# Patient Record
Sex: Female | Born: 1995 | Race: White | Hispanic: No | Marital: Single | Smoking: Never smoker
Health system: Southern US, Community
[De-identification: ages and names within clinical notes are randomized; demographics above are authoritative.]

## PROBLEM LIST (undated history)

## (undated) HISTORY — PX: FINGER SURGERY: SHX640

---
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
## 2017-08-02 IMAGING — CT CT HEAD W/O CM
3 series · 15 of 47 positions shown, 18 images · non-contrast
Comparison: None.

CLINICAL DATA: Acute onset of anxiety and vomiting. Initial
encounter.

EXAM:
CT HEAD WITHOUT CONTRAST
TECHNIQUE: Contiguous axial images were obtained from the base of the skull
through the vertex without intravenous contrast.

[Series 2: head wo · axial · 0.47mm/px · z∈[-179,-54]mm · 9 of 30 slices shown, 12 images]
[im 3/30  brain]
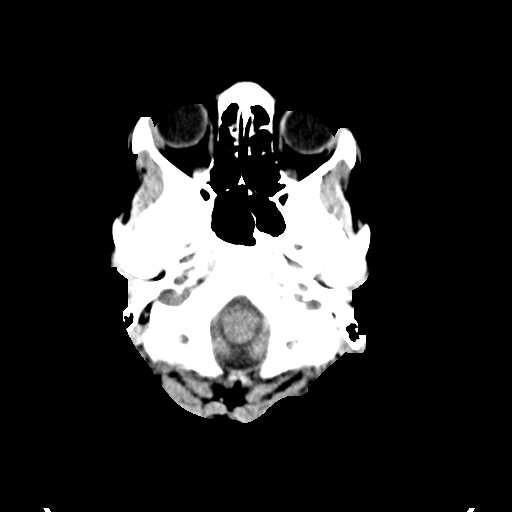
[im 3/30  bone]
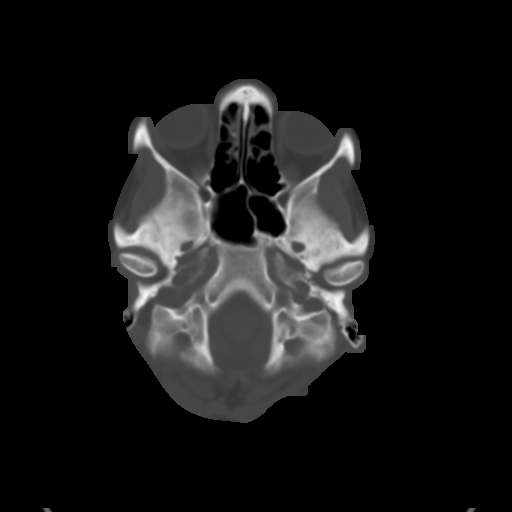
[im 6/30  brain]
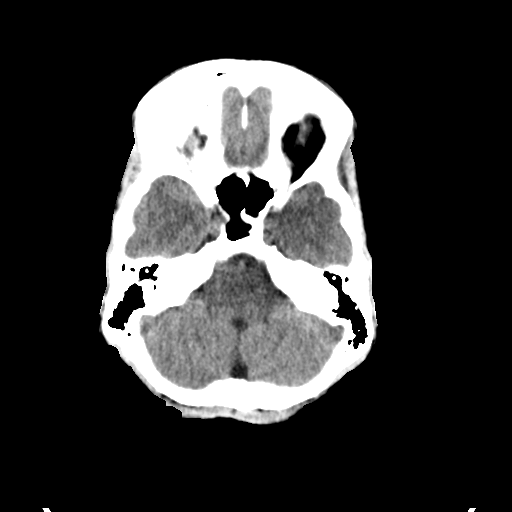
[im 9/30  brain]
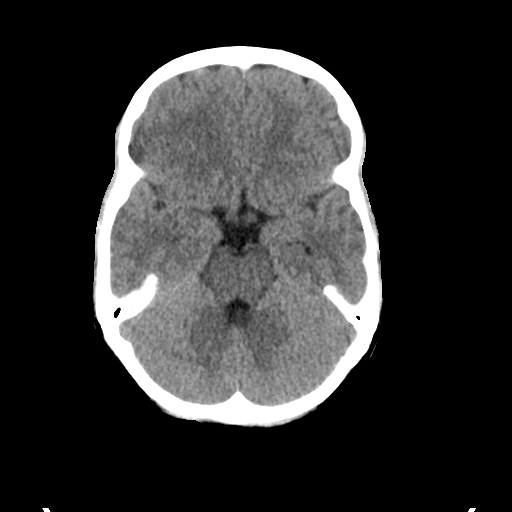
[im 12/30  brain]
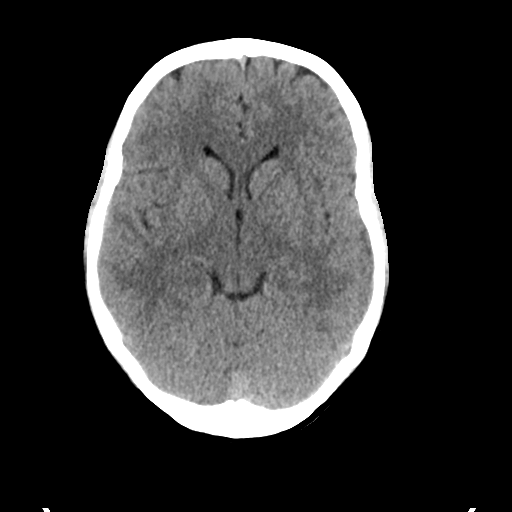
[im 16/30  brain]
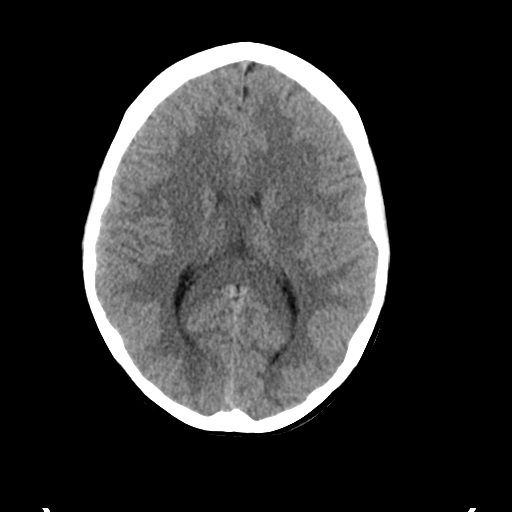
[im 16/30  bone]
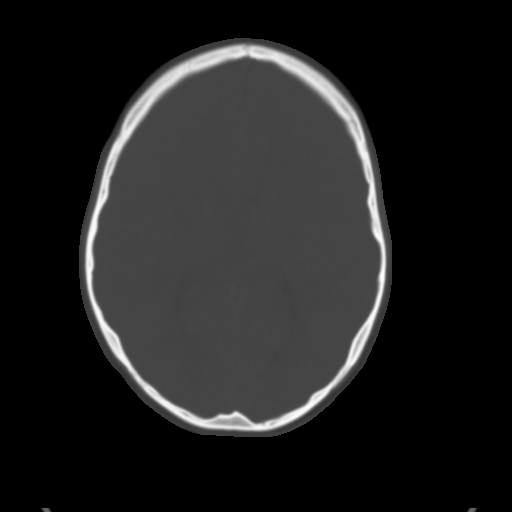
[im 19/30  brain]
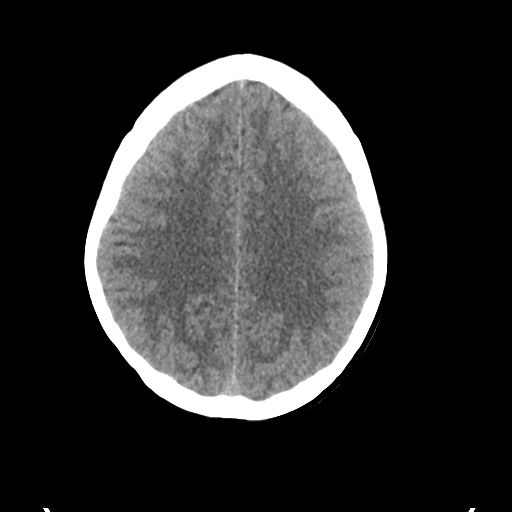
[im 22/30  brain]
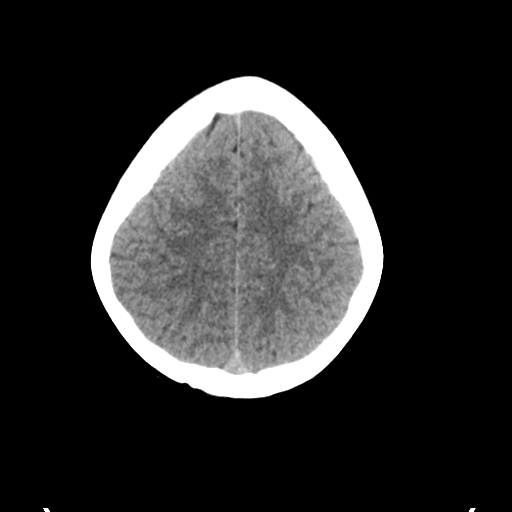
[im 25/30  brain]
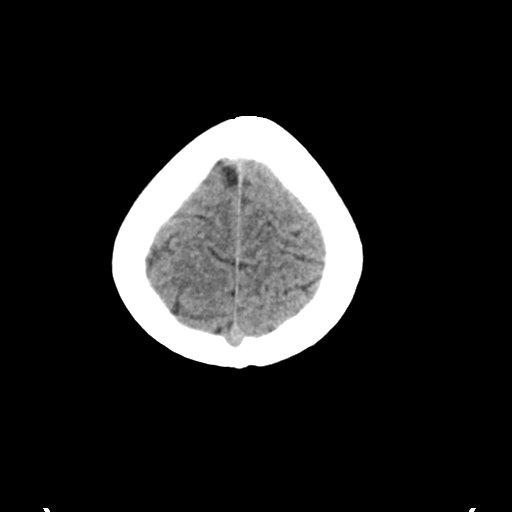
[im 28/30  brain]
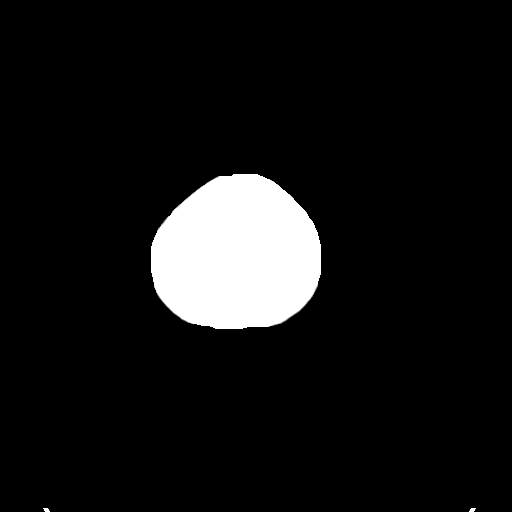
[im 28/30  bone]
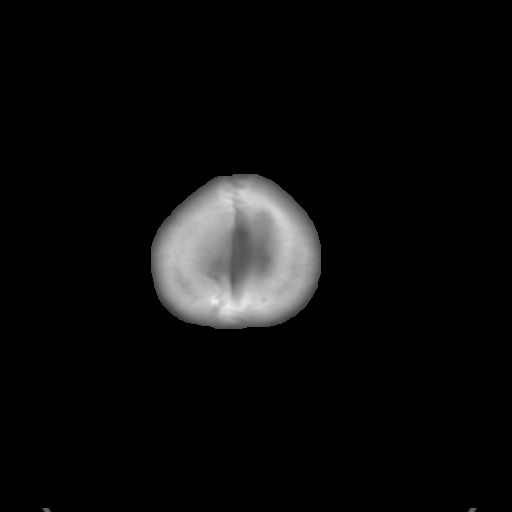

[Series 4: coronal soft tissue · coronal · 0.30mm/px · 3 of 61 slices shown]
[im 21/61  brain]
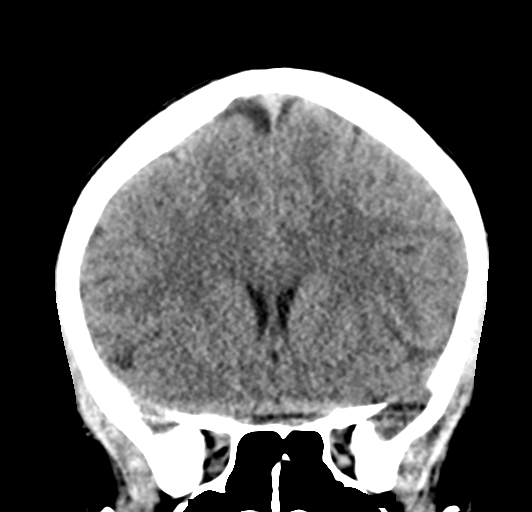
[im 27/61  brain]
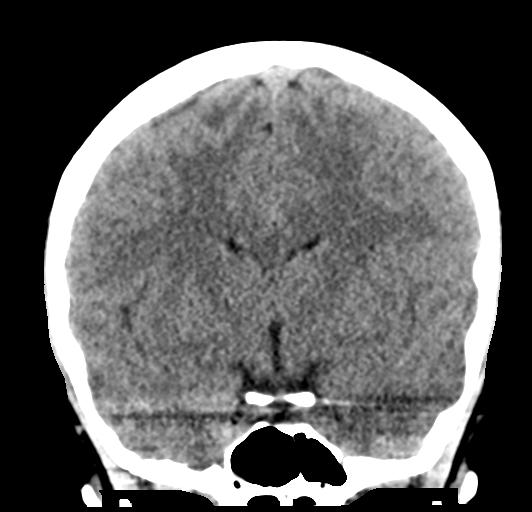
[im 34/61  brain]
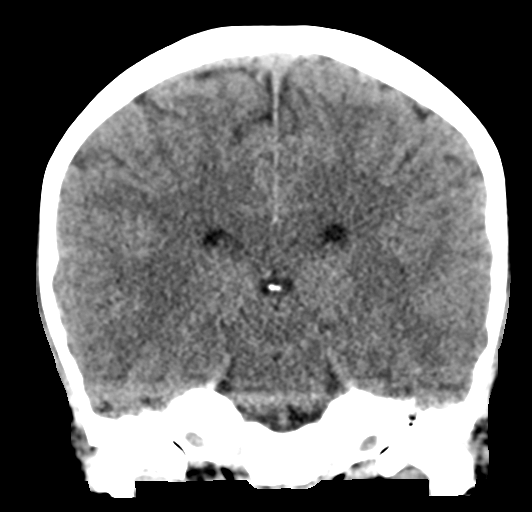

[Series 5: sagittal soft tissue · sagittal · 0.29mm/px · 3 of 49 slices shown]
[im 17/49  brain]
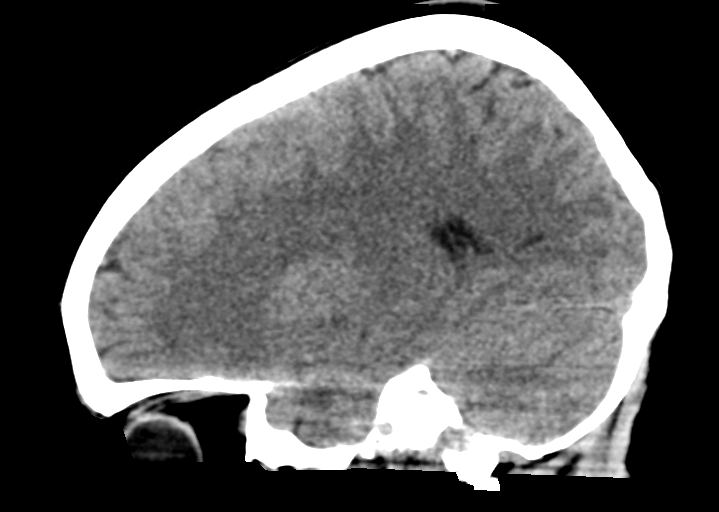
[im 25/49  brain]
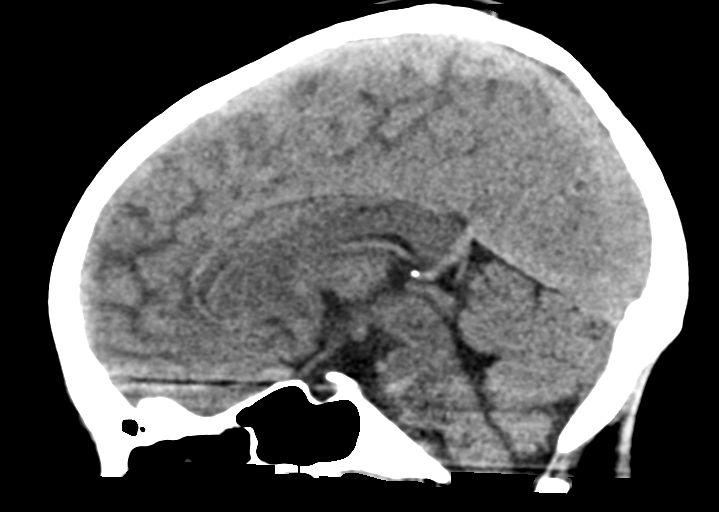
[im 33/49  brain]
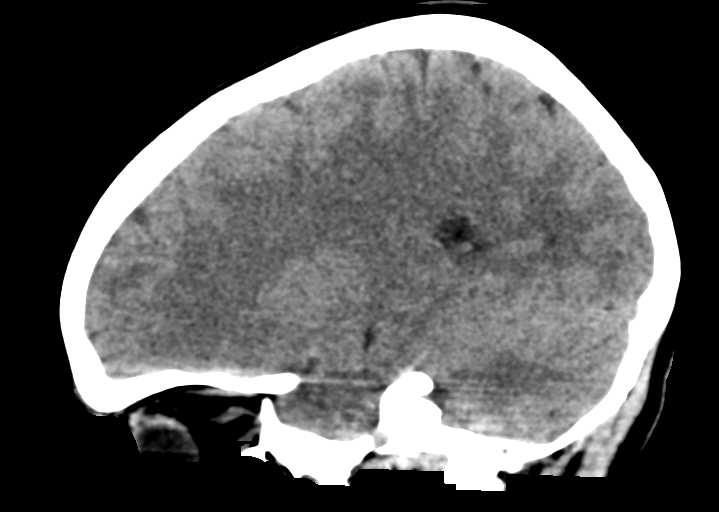

[15 of 47 positions shown; findings below may reference images not displayed]

FINDINGS: Brain: No evidence of acute infarction, hemorrhage, hydrocephalus,
extra-axial collection or mass lesion/mass effect.

The posterior fossa, including the cerebellum, brainstem and fourth
ventricle, is within normal limits. The third and lateral
ventricles, and basal ganglia are unremarkable in appearance. The
cerebral hemispheres are symmetric in appearance, with normal
gray-white differentiation. No mass effect or midline shift is seen.

Vascular: No hyperdense vessel or unexpected calcification.

Skull: There is no evidence of fracture; visualized osseous
structures are unremarkable in appearance.

Sinuses/Orbits: The visualized portions of the orbits are within
normal limits. The paranasal sinuses and mastoid air cells are
well-aerated.

Other: No significant soft tissue abnormalities are seen.
IMPRESSION: Unremarkable noncontrast CT of the head.
